# Patient Record
Sex: Female | Born: 2000 | Race: Black or African American | Hispanic: No | Marital: Single
Health system: Southern US, Community
[De-identification: ages and names within clinical notes are randomized; demographics above are authoritative.]

---
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
## 2019-04-30 ENCOUNTER — Ambulatory Visit: Payer: Self-pay | Admitting: Nurse Practitioner

## 2019-05-03 ENCOUNTER — Ambulatory Visit: Payer: Self-pay | Admitting: Family Medicine

## 2020-02-02 ENCOUNTER — Other Ambulatory Visit: Payer: Self-pay

## 2020-02-02 ENCOUNTER — Emergency Department
Admission: EM | Admit: 2020-02-02 | Discharge: 2020-02-02 | Disposition: A | Discharge status: Home / Self Care | Payer: BC Managed Care – PPO | Attending: Emergency Medicine | Admitting: Emergency Medicine

## 2020-02-02 ENCOUNTER — Encounter: Payer: Self-pay | Admitting: Emergency Medicine

## 2020-02-02 ENCOUNTER — Emergency Department: Payer: BC Managed Care – PPO

## 2020-02-02 DIAGNOSIS — R059 Cough, unspecified: Secondary | ICD-10-CM | POA: Insufficient documentation

## 2020-02-02 DIAGNOSIS — J069 Acute upper respiratory infection, unspecified: Secondary | ICD-10-CM | POA: Diagnosis not present

## 2020-02-02 DIAGNOSIS — R42 Dizziness and giddiness: Secondary | ICD-10-CM | POA: Insufficient documentation

## 2020-02-02 DIAGNOSIS — R197 Diarrhea, unspecified: Secondary | ICD-10-CM | POA: Diagnosis present

## 2020-02-02 LAB — BASIC METABOLIC PANEL
Anion gap: 10 (ref 5–15)
BUN: 12 mg/dL (ref 6–20)
CO2: 21 mmol/L — ABNORMAL LOW (ref 22–32)
Calcium: 8.9 mg/dL (ref 8.9–10.3)
Chloride: 107 mmol/L (ref 98–111)
Creatinine, Ser: 0.82 mg/dL (ref 0.44–1.00)
GFR, Estimated: >60 mL/min (ref >60)
Glucose, Bld: 97 mg/dL (ref 70–99)
Potassium: 3.9 mmol/L (ref 3.5–5.1)
Sodium: 138 mmol/L (ref 135–145)

## 2020-02-02 LAB — URINALYSIS, COMPLETE (UACMP) WITH MICROSCOPIC
Bacteria, UA: NONE SEEN
Bilirubin Urine: NEGATIVE
Glucose, UA: NEGATIVE mg/dL
Ketones, ur: NEGATIVE mg/dL
Leukocytes,Ua: NEGATIVE
Nitrite: NEGATIVE
Protein, ur: 30 mg/dL — AB
Specific Gravity, Urine: 1.024 (ref 1.005–1.030)
pH: 5 (ref 5.0–8.0)

## 2020-02-02 LAB — CBC WITH DIFFERENTIAL/PLATELET
Abs Immature Granulocytes: 0.03 10*3/uL (ref 0.00–0.07)
Basophils Absolute: 0 10*3/uL (ref 0.0–0.1)
Basophils Relative: 0 %
Eosinophils Absolute: 0.1 10*3/uL (ref 0.0–0.5)
Eosinophils Relative: 1 %
HCT: 41.3 % (ref 36.0–46.0)
Hemoglobin: 12.7 g/dL (ref 12.0–15.0)
Immature Granulocytes: 0 %
Lymphocytes Relative: 31 %
Lymphs Abs: 3 10*3/uL (ref 0.7–4.0)
MCH: 21.7 pg — ABNORMAL LOW (ref 26.0–34.0)
MCHC: 30.8 g/dL (ref 30.0–36.0)
MCV: 70.6 fL — ABNORMAL LOW (ref 80.0–100.0)
Monocytes Absolute: 0.7 10*3/uL (ref 0.1–1.0)
Monocytes Relative: 7 %
Neutro Abs: 5.9 10*3/uL (ref 1.7–7.7)
Neutrophils Relative %: 61 %
Platelets: 427 10*3/uL — ABNORMAL HIGH (ref 150–400)
RBC: 5.85 MIL/uL — ABNORMAL HIGH (ref 3.87–5.11)
RDW: 15.6 % — ABNORMAL HIGH (ref 11.5–15.5)
WBC: 9.8 10*3/uL (ref 4.0–10.5)
nRBC: 0 % (ref 0.0–0.2)

## 2020-02-02 LAB — POC URINE PREG, ED: Preg Test, Ur: NEGATIVE

## 2020-02-02 NOTE — ED Notes (Signed)
Pt speaking in full sentences, no airway obstruction - states that she feels her symptoms have started to improve but now is c/o itchiness to her face

## 2020-02-02 NOTE — ED Triage Notes (Signed)
Pt to ED via POV stating that she thinks she is having a reaction to medication. Pt states that she a couple days ago she started taking amoxicillin for chest congestion. Pt states that it caused her to have bad diarrhea, vaginal discharge and it made her feel dizzy. Pt states that she stopped that, was changed to z pak today. Pt states that she took her first dose of that today and is now feeling worse, pt states that it is making her feel like she does not have any energy and she feels like it is causing her face to swell. Pt is in NAD.

## 2020-02-02 NOTE — Discharge Instructions (Addendum)
Follow-up with your primary care provider or [facility] acute care if any continued problems.  Drink lots of fluids to make up for the fluid that you lost during the diarrhea.  Tylenol or ibuprofen if needed for body aches, fever, headache.  Discontinue taking the Z-Pak as it will not help with a virus.

## 2020-02-02 NOTE — ED Provider Notes (Signed)
[facility] Emergency Department Provider Note  ____________________________________________   First MD Initiated Contact with Patient 02/02/20 1626     (approximate)  I have reviewed the triage vital signs and the nursing notes.   HISTORY  Chief Complaint Medication Reaction   HPI Taylor Chan is a 19 y.o. female presents to the ED with concerns of a possible reaction to medication.  Patient states that she was seen by an ED visit and prescribed amoxicillin for chest congestion.  She states that she began having diarrhea, vaginal discharge and some dizziness and was told to discontinue taking the antibiotic.  She was seen today at an urgent care where her medication was changed to a Z-Pak.  Patient states that she took the first dose today and began feeling worse than what she did prior to taking the Z-Pak.  Patient reports a occasionally productive cough that has been going on for approximately 6 days.  She denies any fever, chills, nausea or vomiting.  Patient reports that a Covid and influenza test was done at the urgent care today and was reported as negative.  She denies any change in taste or smell and is not aware of any Covid contacts.  She denies any previous history of asthma.  Currently she denies any difficulty breathing or talking.        History reviewed. No pertinent past medical history.  There are no problems to display for this patient.   History reviewed. No pertinent surgical history.  Prior to Admission medications   Not on File    Allergies Patient has no known allergies.  No family history on file.  Social History Social History   Tobacco Use  . Smoking status: Not on file  Substance Use Topics  . Alcohol use: Not on file  . Drug use: Not on file    Review of Systems Constitutional: No fever/chills Eyes: No visual changes. ENT: No sore throat. Cardiovascular: Denies chest pain. Respiratory: Denies shortness of  breath.  Positive cough. Gastrointestinal: No abdominal pain.  No nausea, no vomiting.  Resolving diarrhea.  No constipation. Genitourinary: Negative for dysuria. Musculoskeletal: Negative for muscle skeletal pain. Skin: Negative for rash. Neurological: Negative for headaches, focal weakness or numbness. ____________________________________________   PHYSICAL EXAM:  VITAL SIGNS: ED Triage Vitals  Enc Vitals Group     BP 02/02/20 1613 119/63     Pulse Rate 02/02/20 1613 98     Resp 02/02/20 1613 16     Temp 02/02/20 1613 99 F (37.2 C)     Temp Source 02/02/20 1613 Oral     SpO2 02/02/20 1613 99 %     Weight 02/02/20 1611 244 lb (110.7 kg)     Height 02/02/20 1611 5\' 6"  (1.676 m)     Head Circumference --      Peak Flow --      Pain Score 02/02/20 1611 0     Pain Loc --      Pain Edu? --      Excl. in GC? --     Constitutional: Alert and oriented. Well appearing and in no acute distress.  Patient is talking in complete sentences without any difficulty or noted shortness of breath. Eyes: Conjunctivae are normal. PERRL. EOMI. Head: Atraumatic. Nose: No congestion/rhinnorhea. Mouth/Throat: Mucous membranes are moist.  Oropharynx non-erythematous.  No exudate and uvula is midline. Neck: No stridor.   Cardiovascular: Normal rate, regular rhythm. Grossly normal heart sounds.  Good peripheral circulation. Respiratory:  Normal respiratory effort.  No retractions. Lungs CTAB. Gastrointestinal: Soft and nontender. No distention.  Musculoskeletal: His upper and lower extremities any difficulty.  Normal gait was noted. Neurologic:  Normal speech and language. No gross focal neurologic deficits are appreciated. No gait instability. Skin:  Skin is warm, dry and intact. No rash noted. Psychiatric: Mood and affect are normal. Speech and behavior are normal.  ____________________________________________   LABS (all labs ordered are listed, but only abnormal results are displayed)  Labs  Reviewed  URINALYSIS, COMPLETE (UACMP) WITH MICROSCOPIC - Abnormal; Notable for the following components:      Result Value   Color, Urine YELLOW (*)    APPearance HAZY (*)    Hgb urine dipstick SMALL (*)    Protein, ur 30 (*)    All other components within normal limits  BASIC METABOLIC PANEL - Abnormal; Notable for the following components:   CO2 21 (*)    All other components within normal limits  CBC WITH DIFFERENTIAL/PLATELET - Abnormal; Notable for the following components:   RBC 5.85 (*)    MCV 70.6 (*)    MCH 21.7 (*)    RDW 15.6 (*)    Platelets 427 (*)    All other components within normal limits  CBC WITH DIFFERENTIAL/PLATELET  POC URINE PREG, ED     RADIOLOGY I, Tommi Rumps, personally viewed and evaluated these images (plain radiographs) as part of my medical decision making, as well as reviewing the written report by the radiologist.    Official radiology report(s): DG Chest 2 View  Result Date: 02/02/2020 CLINICAL DATA:  Cough EXAM: CHEST - 2 VIEW COMPARISON:  None. FINDINGS: The heart size and mediastinal contours are within normal limits. Both lungs are clear. The visualized skeletal structures are unremarkable. IMPRESSION: No acute abnormality of the lungs. Electronically Signed   By: Lauralyn Primes M.D.   On: 02/02/2020 18:26    ____________________________________________   PROCEDURES  Procedure(s) performed (including Critical Care):  Procedures   ____________________________________________   INITIAL IMPRESSION / ASSESSMENT AND PLAN / ED COURSE  As part of my medical decision making, I reviewed the following data within the electronic MEDICAL RECORD NUMBER Notes from prior ED visits and [place] Controlled Substance Database  19 year old female presents to the ED with complaint of a occasional productive cough for the last 6 days.  Patient denies any fever or chills.  She was seen with an ED visit originally and was prescribed amoxicillin which caused  GI upset along with a vaginal discharge.  Today she was seen at an urgent care and prescribed a Z-Pak which has only made her feel worse.  Patient states that a Covid test was done and both Covid and influenza were negative today.  Physical exam is more consistent with a viral URI.  X-rays negative for any acute changes.  Lab work was reassuring.  Patient was made aware and agrees that she will discontinue taking the Z-Pak as this most likely is a viral URI.  Patient was encouraged to drink lots of fluids and to follow-up with her primary care provider if any continued problems.  ____________________________________________   FINAL CLINICAL IMPRESSION(S) / ED DIAGNOSES  Final diagnoses:  Viral URI with cough     ED Discharge Orders    None      *Please note:  Taylor Chan was evaluated in Emergency Department on 02/02/2020 for the symptoms described in the history of present illness. She was evaluated in the context of  the global COVID-19 pandemic, which necessitated consideration that the patient might be at risk for infection with the SARS-CoV-2 virus that causes COVID-19. Institutional protocols and algorithms that pertain to the evaluation of patients at risk for COVID-19 are in a state of rapid change based on information released by regulatory bodies including the CDC and federal and state organizations. These policies and algorithms were followed during the patient's care in the ED.  Some ED evaluations and interventions may be delayed as a result of limited staffing during and the pandemic.*   Note:  This document was prepared using Dragon voice recognition software and may include unintentional dictation errors.    Tommi Rumps, PA-C 02/02/20 1843    Minna Antis, MD 02/02/20 2221

## 2022-04-14 IMAGING — CR DG CHEST 2V
1 series · 2 of 2 positions shown · non-contrast
Comparison: None.

CLINICAL DATA: Cough

EXAM:
CHEST - 2 VIEW

[Series 1: dg chest 2 view · 0.14mm/px · 2 of 2 slices shown]
[im 1/2]
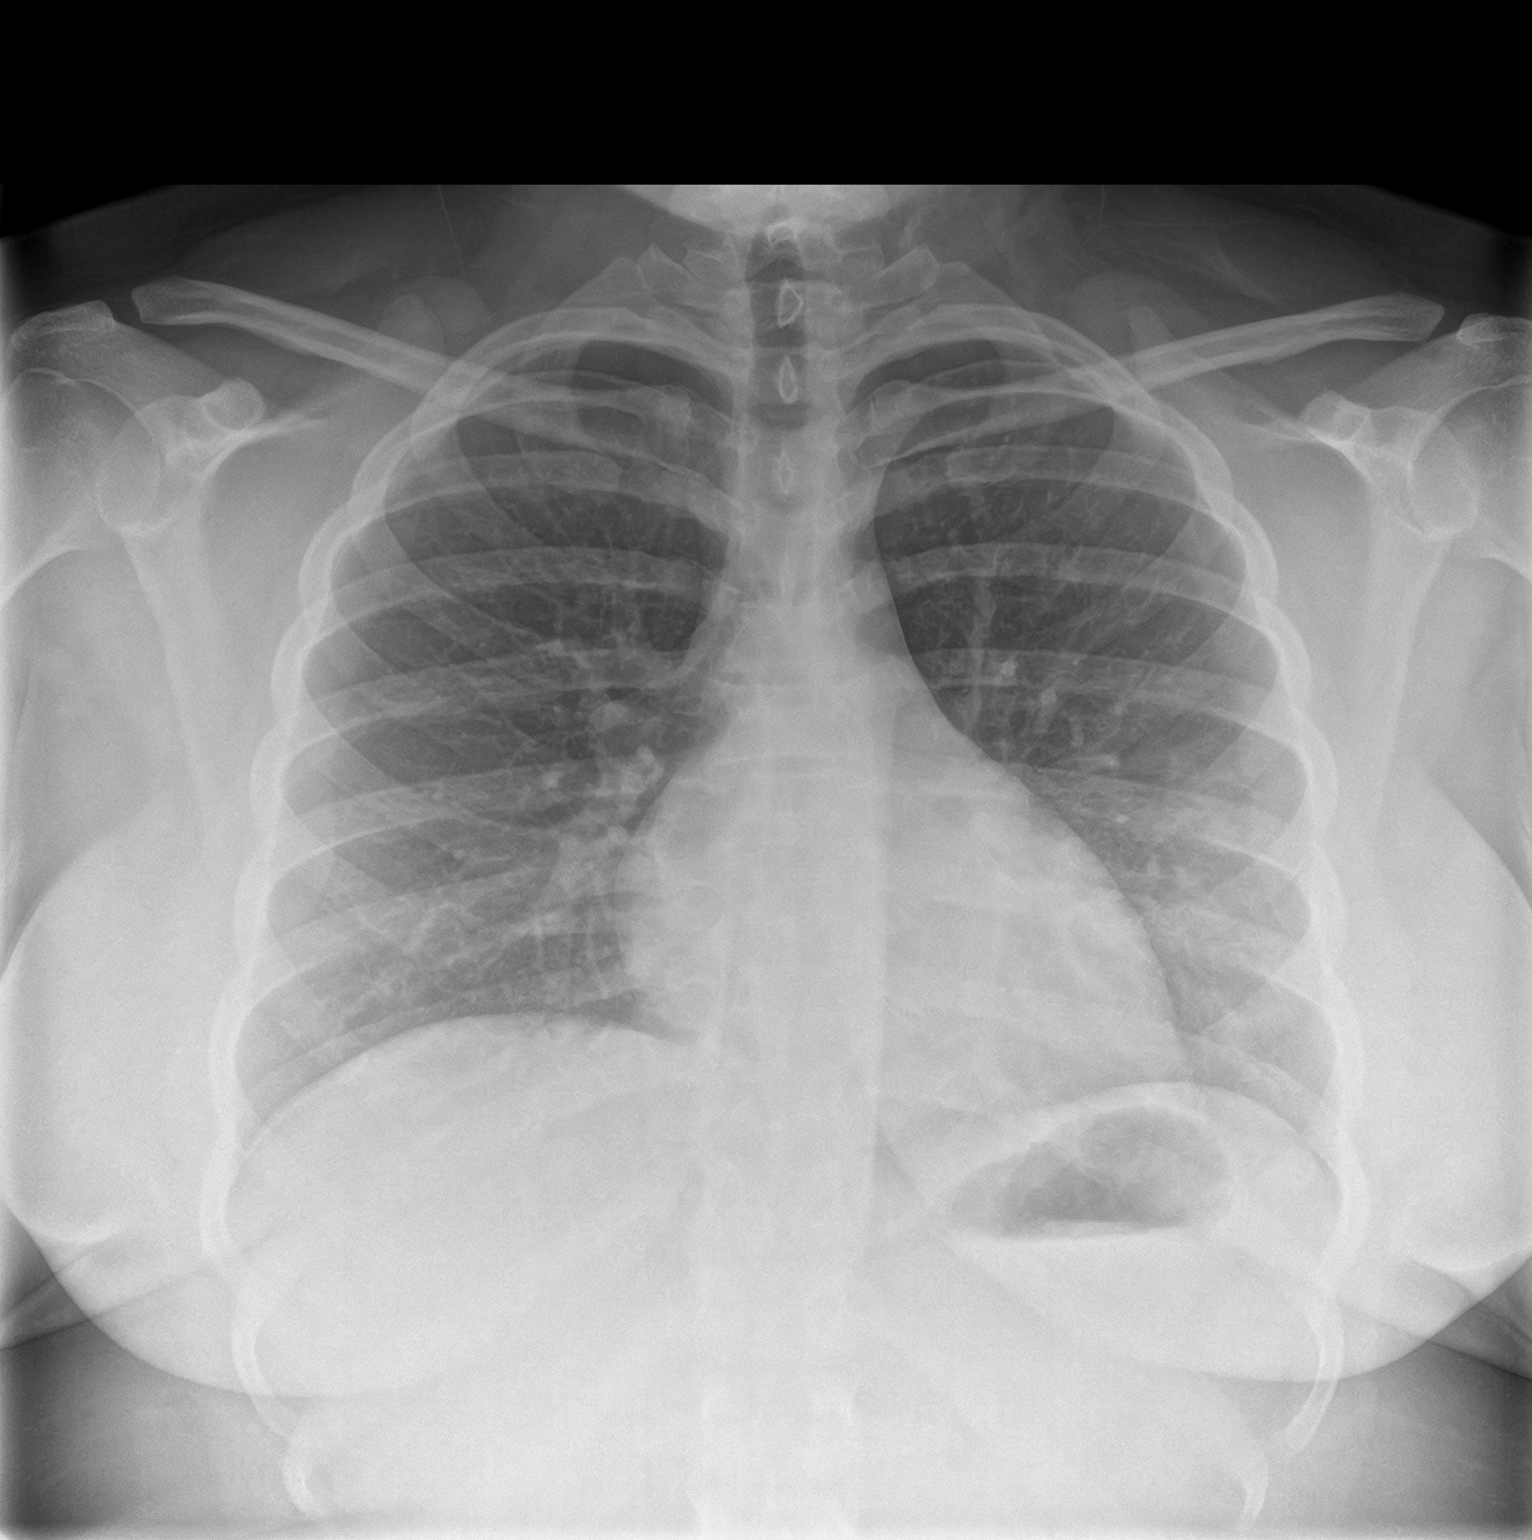
[im 2/2]
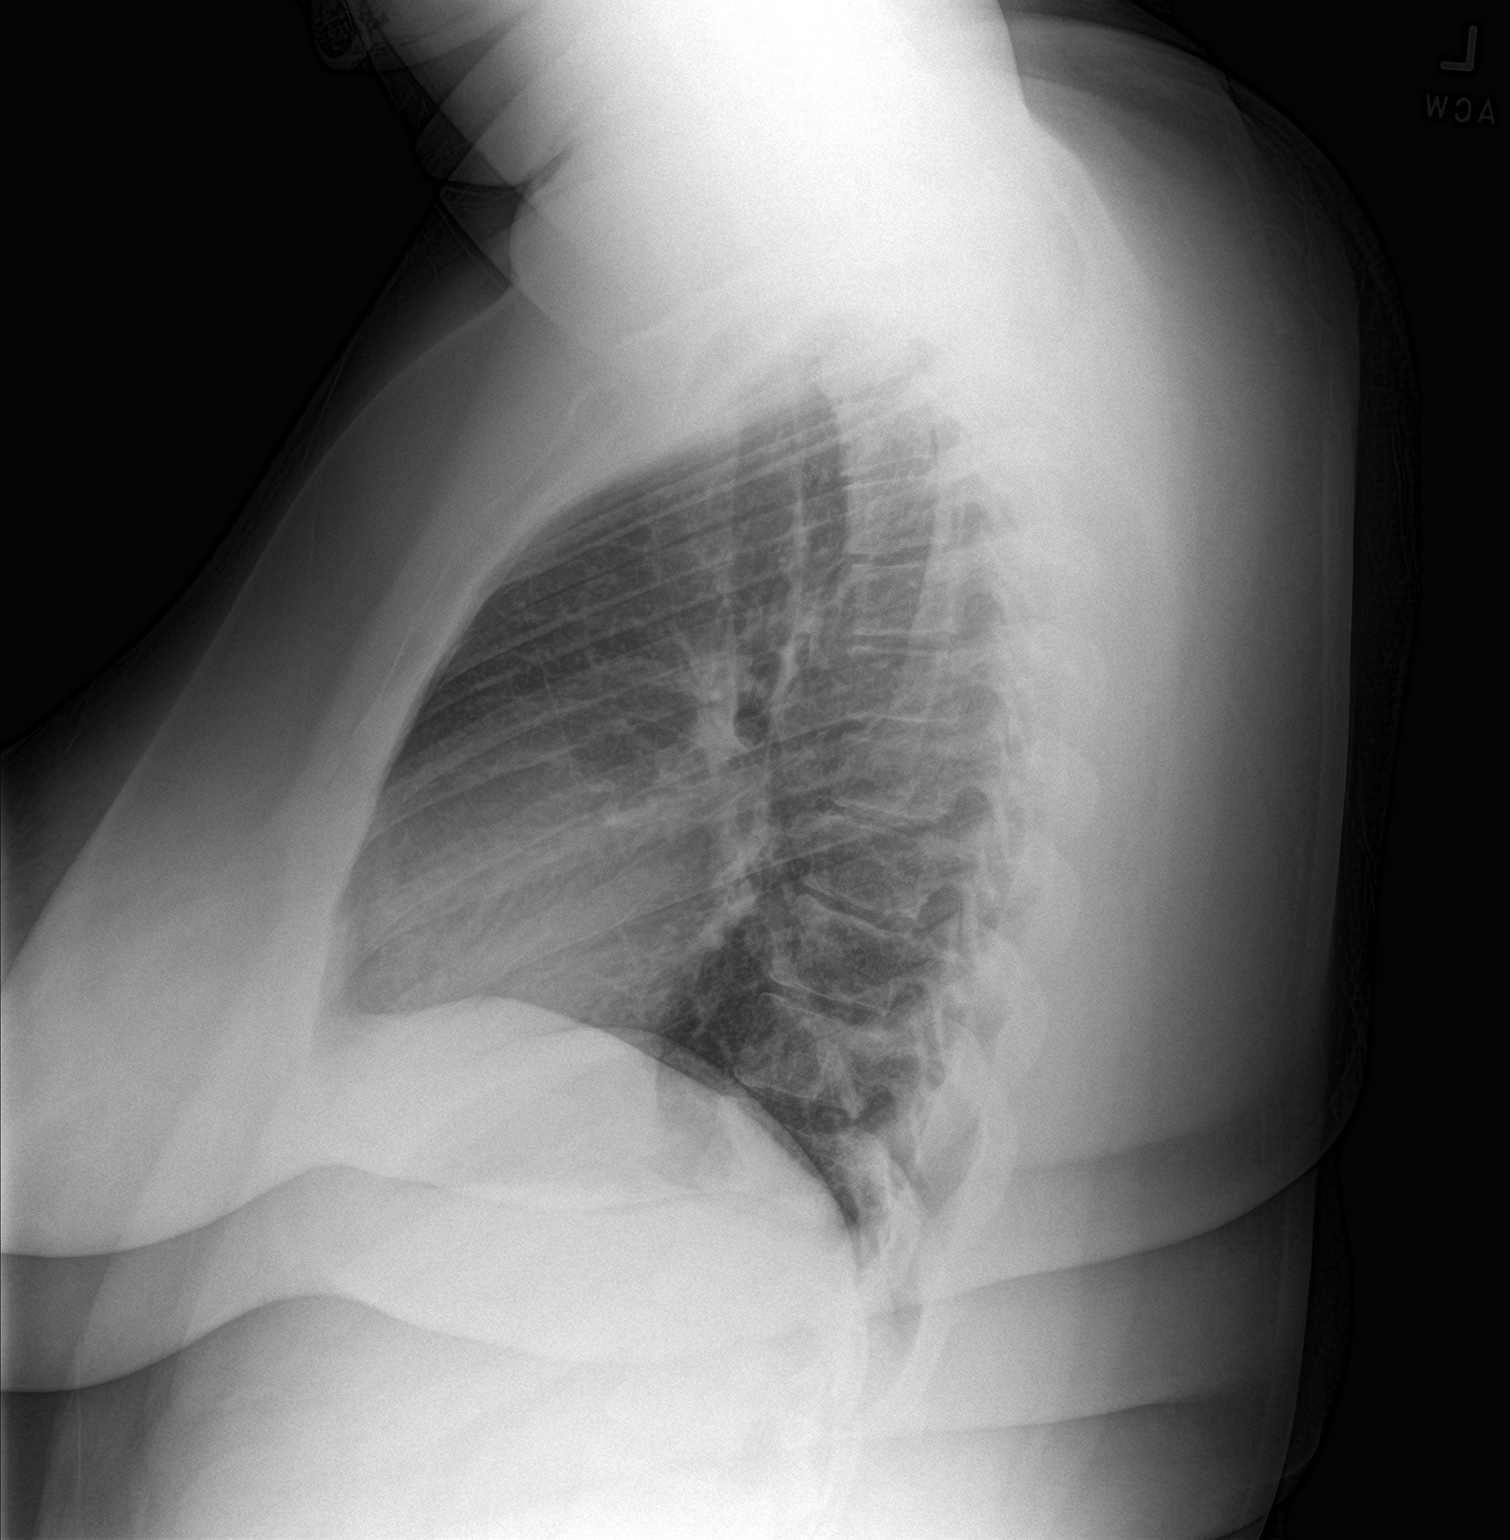

[2 of 2 positions shown; findings below may reference images not displayed]

FINDINGS: The heart size and mediastinal contours are within normal limits.
Both lungs are clear. The visualized skeletal structures are
unremarkable.
IMPRESSION: No acute abnormality of the lungs.
